# Patient Record
Sex: Male | Born: 1960 | Race: White | Hispanic: No | Marital: Married | Smoking: Former smoker
Health system: Southern US, Community
[De-identification: ages and names within clinical notes are randomized; demographics above are authoritative.]

## PROBLEM LIST (undated history)

## (undated) DIAGNOSIS — E079 Disorder of thyroid, unspecified: Secondary | ICD-10-CM

## (undated) DIAGNOSIS — E7800 Pure hypercholesterolemia, unspecified: Secondary | ICD-10-CM

## (undated) HISTORY — DX: Disorder of thyroid, unspecified: E07.9

---

## 2008-06-06 ENCOUNTER — Encounter: Admission: RE | Admit: 2008-06-06 | Discharge: 2008-06-06 | Payer: Self-pay | Admitting: Endocrinology

## 2010-03-24 IMAGING — US US SOFT TISSUE HEAD/NECK
1 series · 14 of 15 positions shown · non-contrast
Comparison: None

CLINICAL DATA: Thyroid gland enlargement

THYROID ULTRASOUND
TECHNIQUE: Ultrasound examination of the thyroid gland and
adjacent soft tissues was performed.

[Series 1: us soft tissue head/neck · 0.08mm/px · 14 of 15 slices shown]
[im 1/15]
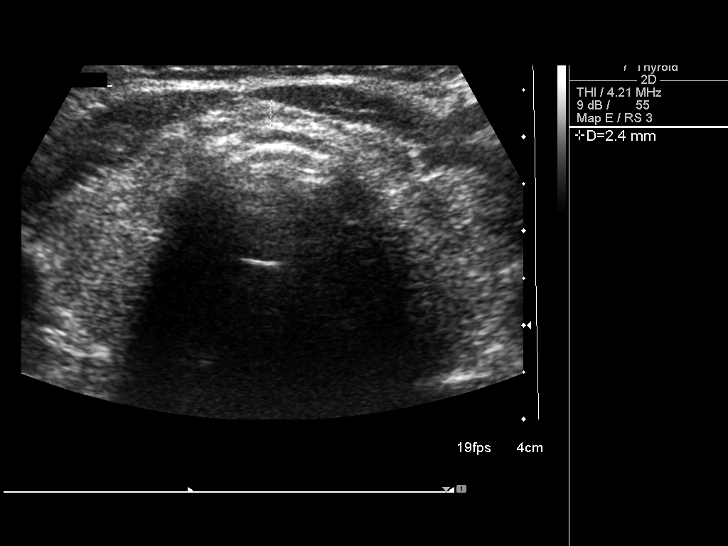
[im 2/15]
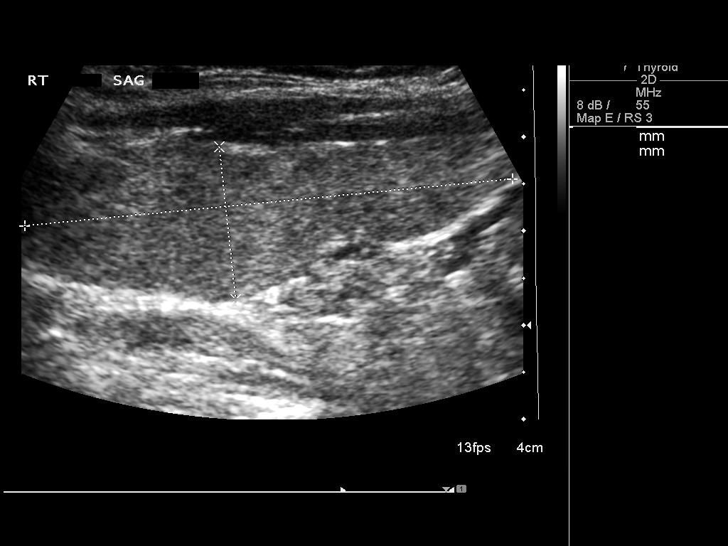
[im 3/15]
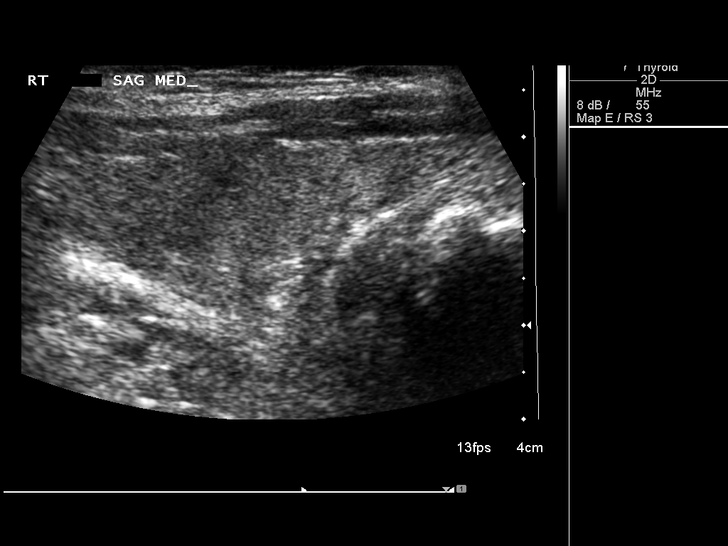
[im 4/15]
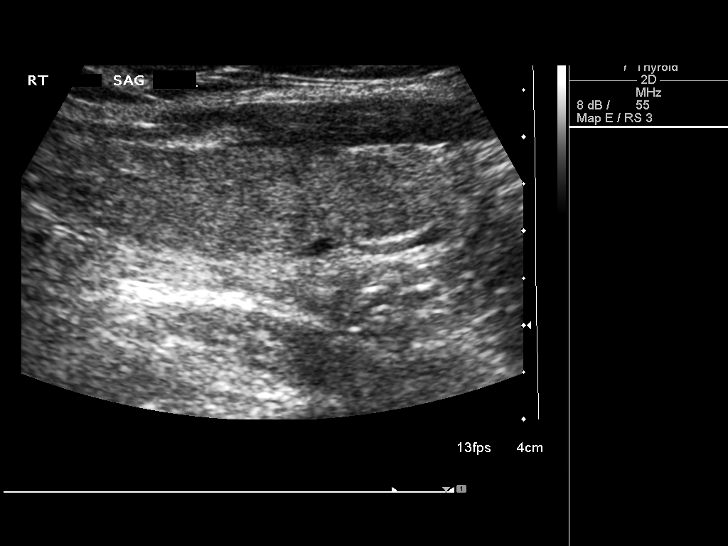
[im 5/15]
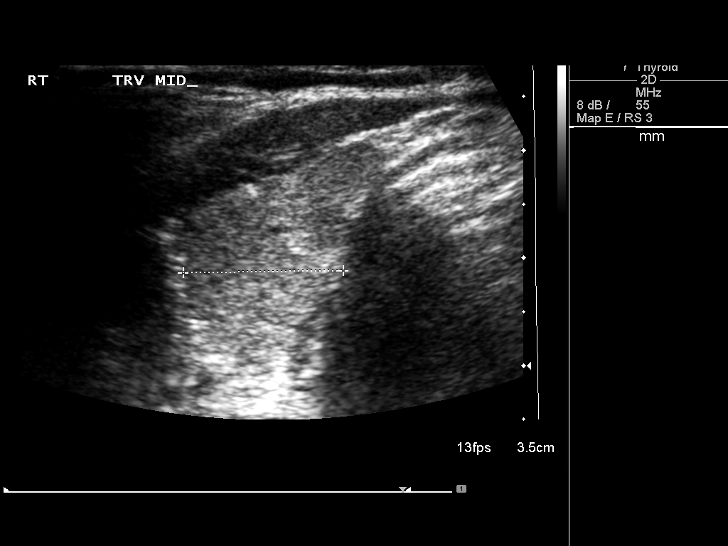
[im 6/15]
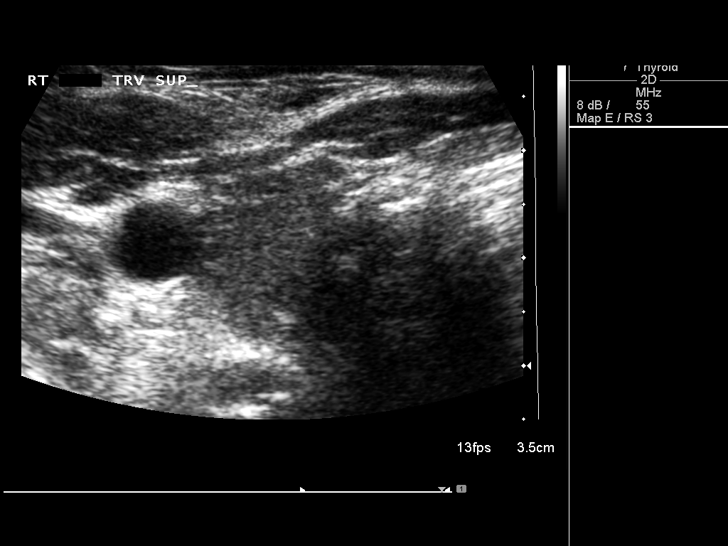
[im 7/15]
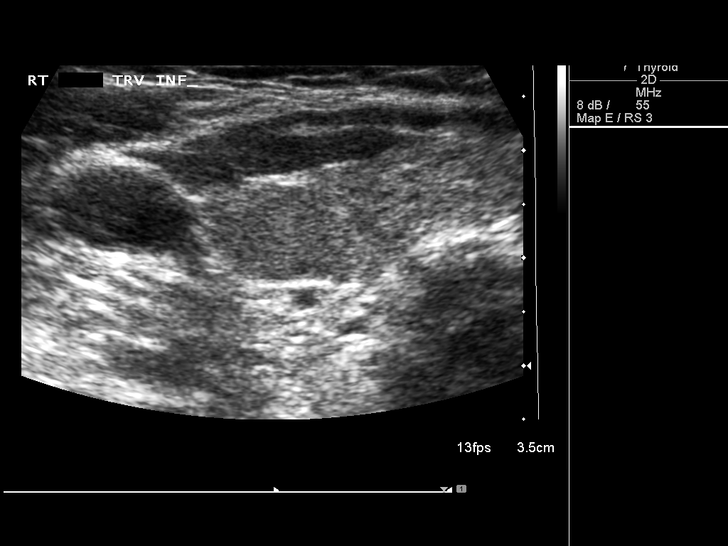
[im 9/15]
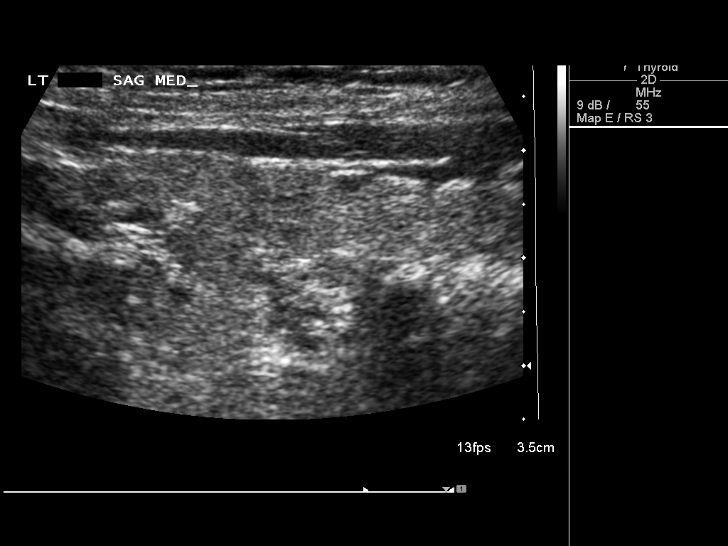
[im 10/15]
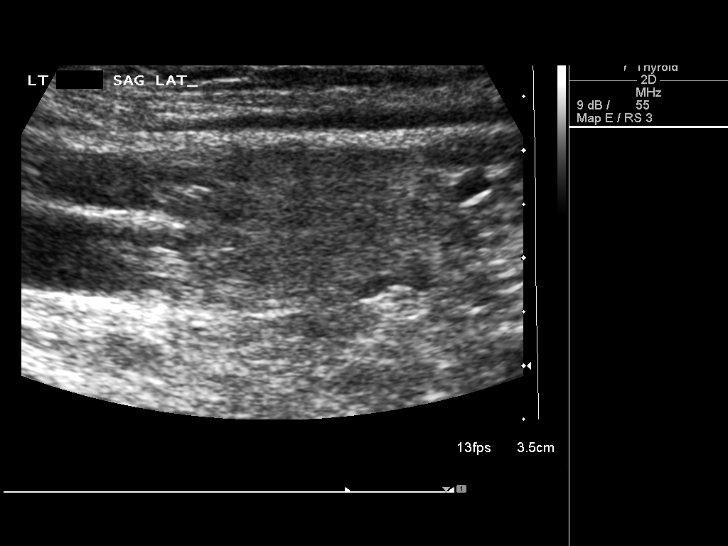
[im 11/15]
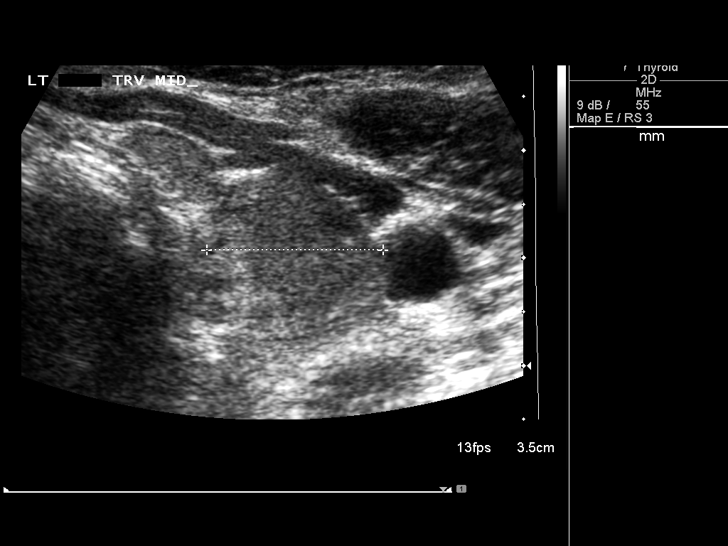
[im 12/15]
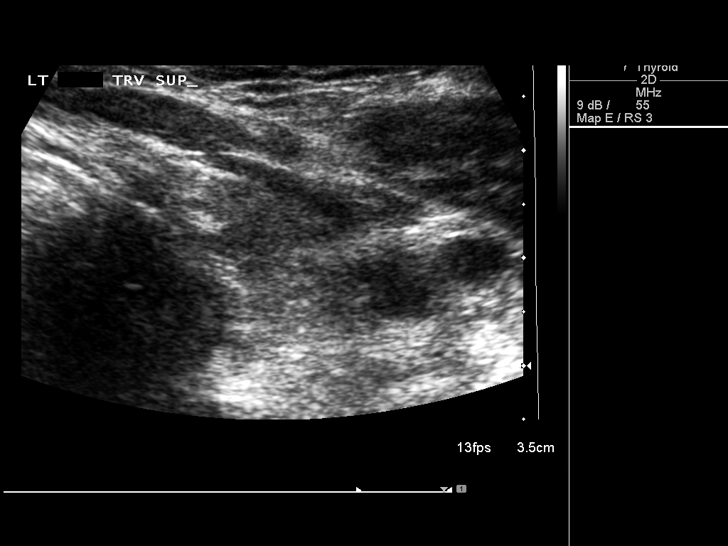
[im 13/15]
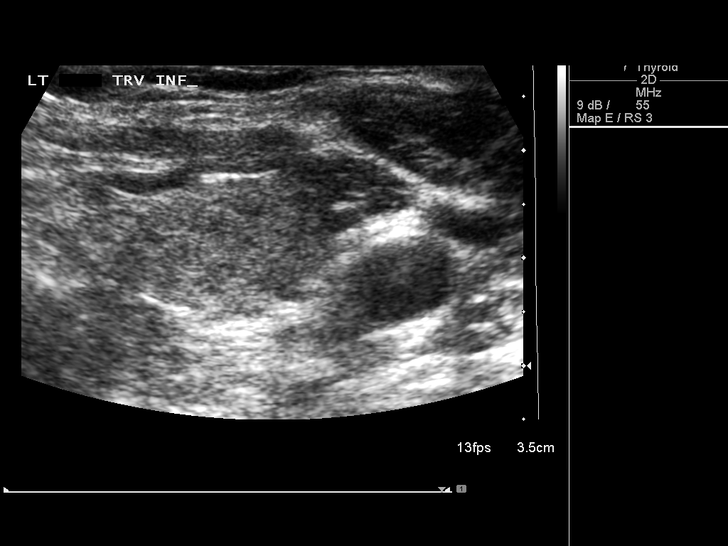
[im 14/15]
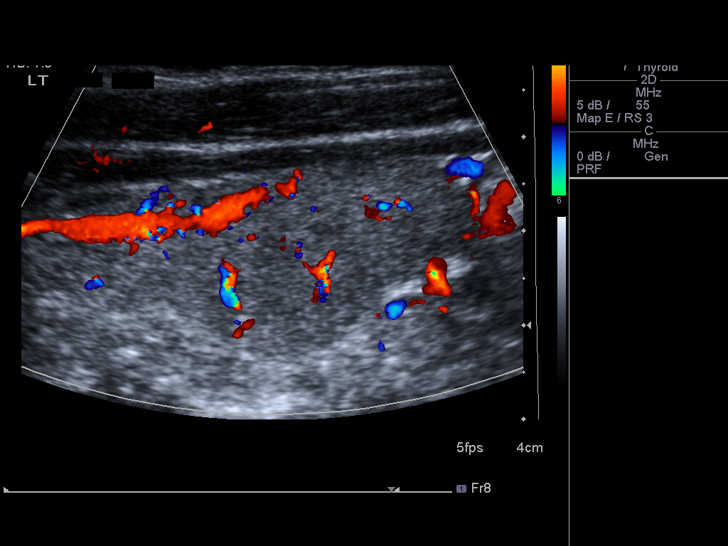
[im 15/15]
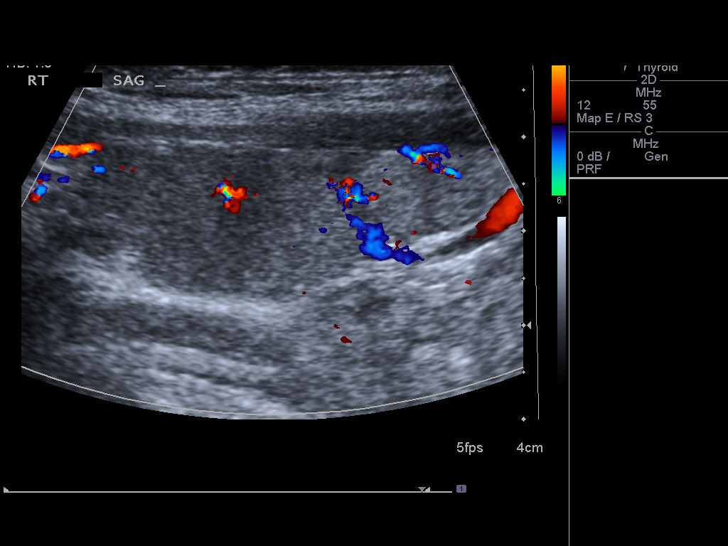

[14 of 15 positions shown; findings below may reference images not displayed]

FINDINGS: The right lobe of the thyroid gland measures 5.2 x 1.6 x
1.5 cm.  No nodule identified.

The left lobe measures 4.6 x 1.9 x 1.6 cm.  No nodule identified.

The isthmus measures 2.4 mm.  No nodule noted.
IMPRESSION: 1.  Normal thyroid sonogram.  No nodule or mass identified.

## 2014-08-18 ENCOUNTER — Other Ambulatory Visit: Payer: Self-pay | Admitting: Endocrinology

## 2014-08-18 DIAGNOSIS — N6320 Unspecified lump in the left breast, unspecified quadrant: Secondary | ICD-10-CM

## 2014-08-24 ENCOUNTER — Other Ambulatory Visit: Payer: Self-pay | Admitting: Endocrinology

## 2014-08-24 ENCOUNTER — Ambulatory Visit
Admission: RE | Admit: 2014-08-24 | Discharge: 2014-08-24 | Disposition: A | Discharge status: Home / Self Care | Payer: BLUE CROSS/BLUE SHIELD | Source: Ambulatory Visit | Attending: Endocrinology | Admitting: Endocrinology

## 2014-08-24 DIAGNOSIS — N6320 Unspecified lump in the left breast, unspecified quadrant: Secondary | ICD-10-CM

## 2014-08-24 DIAGNOSIS — R221 Localized swelling, mass and lump, neck: Secondary | ICD-10-CM

## 2014-08-24 MED ORDER — IOPAMIDOL (ISOVUE-300) INJECTION 61%
75.0000 mL | Freq: Once | INTRAVENOUS | Status: AC | PRN
  Administered 2014-08-24: 75 mL via INTRAVENOUS

## 2014-10-13 ENCOUNTER — Telehealth: Payer: Self-pay

## 2014-10-13 NOTE — Telephone Encounter (Signed)
Spoke with pt, he has [employer]. He needs a referral for his endocrinologist. I advised him that he needs to re-establish with Dr. Alwyn Ren (he is on the insurance card as his PCP). Pt states he has an appt on 10/5 and is unable to come see [place] on Mon or Tues with the dates and times I have given him. Dr Alwyn Ren please advise.

## 2014-10-13 NOTE — Telephone Encounter (Signed)
Patient saw Dr. Alwyn Ren several years ago and was referred to see an endodrynologist. Patient states that Dr. Talmage Nap from [facility] needs a confirmation for the referral that was given. [facility]: [phone] Patient phone: [phone]

## 2014-10-13 NOTE — Telephone Encounter (Signed)
RTC

## 2014-10-15 NOTE — Telephone Encounter (Signed)
I have no record of seeing since we were computerized 3 1/2 years ago.  Needs seen before I can do any referrals.  Douglas Perry

## 2014-10-16 NOTE — Telephone Encounter (Signed)
Left message advising message from Dr. Alwyn Ren.

## 2015-12-27 ENCOUNTER — Ambulatory Visit (INDEPENDENT_AMBULATORY_CARE_PROVIDER_SITE_OTHER): Payer: Commercial Managed Care - HMO | Admitting: Family Medicine

## 2015-12-27 VITALS — BP 126/82 | HR 76 | Temp 97.6°F | Resp 18 | Ht 73.5 in | Wt 262.0 lb

## 2015-12-27 DIAGNOSIS — E6609 Other obesity due to excess calories: Secondary | ICD-10-CM

## 2015-12-27 DIAGNOSIS — Z6834 Body mass index (BMI) 34.0-34.9, adult: Secondary | ICD-10-CM

## 2015-12-27 DIAGNOSIS — Z720 Tobacco use: Secondary | ICD-10-CM | POA: Diagnosis not present

## 2015-12-27 DIAGNOSIS — E034 Atrophy of thyroid (acquired): Secondary | ICD-10-CM

## 2015-12-27 NOTE — Progress Notes (Signed)
Subjective:    Patient ID: Douglas Perry, male    DOB: 11/20/1960, 55 y.o.   MRN: [id number]  12/27/2015  Establish Care   HPI This 55 y.o. male presents to establish care.   Endocrinologist: [facility]; thyroid issues; hypothyroidism.  [facility]  [facility] [employer] is requiring referral to endocrinology.  Having most health maintenance performed at Dr. Danella [facility]'s office.  No recent physical.  Frustrated by 30 minute wait for provider in office today.    Last physical:  None Colonoscopy: 2014   REFUSES FLU VACCINES.   There is no immunization history on file for this patient.   BP Readings from Last 3 Encounters:  12/27/15 126/82   Wt Readings from Last 3 Encounters:  12/27/15 262 lb (118.8 kg)     Review of Systems  Constitutional: Negative for activity change, appetite change, chills, diaphoresis, fatigue, fever and unexpected weight change.  HENT: Negative for congestion, dental problem, drooling, ear discharge, ear pain, facial swelling, hearing loss, mouth sores, nosebleeds, postnasal drip, rhinorrhea, sinus pressure, sneezing, sore throat, tinnitus, trouble swallowing and voice change.   Eyes: Negative for photophobia, pain, discharge, redness, itching and visual disturbance.  Respiratory: Negative for apnea, cough, choking, chest tightness, shortness of breath, wheezing and stridor.   Cardiovascular: Negative for chest pain, palpitations and leg swelling.  Gastrointestinal: Negative for abdominal pain, blood in stool, constipation, diarrhea, nausea and vomiting.  Endocrine: Negative for cold intolerance, heat intolerance, polydipsia, polyphagia and polyuria.  Genitourinary: Negative for decreased urine volume, difficulty urinating, discharge, dysuria, enuresis, flank pain, frequency, genital sores, hematuria, penile pain, penile swelling, scrotal swelling, testicular pain and urgency.  Musculoskeletal: Negative for  arthralgias, back pain, gait problem, joint swelling, myalgias, neck pain and neck stiffness.  Skin: Negative for color change, pallor, rash and wound.  Allergic/Immunologic: Negative for environmental allergies, food allergies and immunocompromised state.  Neurological: Negative for dizziness, tremors, seizures, syncope, facial asymmetry, speech difficulty, weakness, light-headedness, numbness and headaches.  Hematological: Negative for adenopathy. Does not bruise/bleed easily.  Psychiatric/Behavioral: Negative for agitation, behavioral problems, confusion, decreased concentration, dysphoric mood, hallucinations, self-injury, sleep disturbance and suicidal ideas. The patient is not nervous/anxious and is not hyperactive.     Past Medical History:  Diagnosis Date  . Thyroid disease    History reviewed. No pertinent surgical history. No Known Allergies Current Outpatient Prescriptions  Medication Sig Dispense Refill  . levothyroxine (SYNTHROID, LEVOTHROID) 88 MCG tablet Take 88 mcg by mouth daily before breakfast.     No current facility-administered medications for this visit.    Social History   Social History  . Marital status: Married    Spouse name: N/A  . Number of children: N/A  . Years of education: N/A   Occupational History  . Medical laboratory scientific officerproject manager     international house builder   Social History Main Topics  . Smoking status: Current Every Day Smoker  . Smokeless tobacco: Never Used  . Alcohol use Yes  . Drug use: No  . Sexual activity: Not on file   Other Topics Concern  . Not on file   Social History Narrative  . No narrative on file   Family History  Problem Relation Age of Onset  . Cancer Mother     lung cancer  . Cancer Father     prostate cancer  . Heart disease Father     CHF       Objective:    BP 126/82 (  BP Location: Right Arm, Patient Position: Sitting, Cuff Size: Large)   Pulse 76   Temp 97.6 F (36.4 C) (Oral)   Resp 18   Ht 6' 1.5"  (1.867 m)   Wt 262 lb (118.8 kg)   SpO2 98%   BMI 34.10 kg/m  Physical Exam  Constitutional: He is oriented to person, place, and time. He appears well-developed and well-nourished. No distress.  HENT:  Head: Normocephalic and atraumatic.  Right Ear: External ear normal.  Left Ear: External ear normal.  Nose: Nose normal.  Mouth/Throat: Oropharynx is clear and moist.  Eyes: Conjunctivae and EOM are normal. Pupils are equal, round, and reactive to light.  Neck: Normal range of motion. Neck supple. Carotid bruit is not present. No thyromegaly present.  Cardiovascular: Normal rate, regular rhythm, normal heart sounds and intact distal pulses.  Exam reveals no gallop and no friction rub.   No murmur heard. Pulmonary/Chest: Effort normal and breath sounds normal. He has no wheezes. He has no rales.  Musculoskeletal:       Right shoulder: Normal.       Left shoulder: Normal.       Cervical back: Normal.  Lymphadenopathy:    He has no cervical adenopathy.  Neurological: He is alert and oriented to person, place, and time. He has normal reflexes. No cranial nerve deficit. He exhibits normal muscle tone. Coordination normal.  Skin: Skin is warm and dry. No rash noted. He is not diaphoretic.  Psychiatric: He has a normal mood and affect. His behavior is normal. Judgment and thought content normal.   No results found for this or any previous visit.     Assessment & Plan:   1. Hypothyroidism due to acquired atrophy of thyroid   2. Tobacco abuse   3. Class 1 obesity due to excess calories without serious comorbidity with body mass index (BMI) of 34.0 to 34.9 in adult    -updated medical record during visit.   -encourage annual physical examination for all males > age 57. -refer to Dr. Lurene ShadowBallen of endocrinology for ongoing hypothyroidism management. -encourage smoking cessation. -recommend weight loss and regular exercise.   Orders Placed This Encounter  Procedures  . Ambulatory  referral to Endocrinology    Referral Priority:   Routine    Referral Type:   Consultation    Referral Reason:   Specialty Services Required    Number of Visits Requested:   1   Meds ordered this encounter  Medications  . levothyroxine (SYNTHROID, LEVOTHROID) 88 MCG tablet    Sig: Take 88 mcg by mouth daily before breakfast.    No Follow-up on file.   Margia Wiesen Paulita FujitaMartin Lissete Maestas, M.D. [facility]  [place] [address] [place], [place]  [id number] [phone] phone [phone] fax

## 2015-12-27 NOTE — Patient Instructions (Signed)
     IF you received an x-ray today, you will receive an invoice from [facility]. Please contact  Radiology at [phone] with questions or concerns regarding your invoice.   IF you received labwork today, you will receive an invoice from [facility]. Please contact [facility] at [phone] with questions or concerns regarding your invoice.   Our billing staff will not be able to assist you with questions regarding bills from these companies.  You will be contacted with the lab results as soon as they are available. The fastest way to get your results is to activate your My Chart account. Instructions are located on the last page of this paperwork. If you have not heard from us regarding the results in 2 weeks, please contact this office.      

## 2015-12-28 ENCOUNTER — Telehealth: Payer: Self-pay

## 2015-12-28 NOTE — Telephone Encounter (Signed)
Patient is calling because [facility] Dr. Talmage NapBalan. Please advise!  [phone]

## 2015-12-28 NOTE — Telephone Encounter (Signed)
Once ov notes are complete referral will be placed to Dr. Talmage NapBalan office    thanks

## 2015-12-31 DIAGNOSIS — E034 Atrophy of thyroid (acquired): Secondary | ICD-10-CM | POA: Insufficient documentation

## 2015-12-31 DIAGNOSIS — Z720 Tobacco use: Secondary | ICD-10-CM | POA: Insufficient documentation

## 2015-12-31 DIAGNOSIS — Z6834 Body mass index (BMI) 34.0-34.9, adult: Secondary | ICD-10-CM

## 2015-12-31 DIAGNOSIS — E6609 Other obesity due to excess calories: Secondary | ICD-10-CM | POA: Insufficient documentation

## 2015-12-31 NOTE — Telephone Encounter (Signed)
Note completed. [facility]

## 2016-06-10 IMAGING — CT CT NECK W/ CM
3 of 5 series · 13 of 33 positions shown, 16 images · IV contrast (iopamidol)
Comparison: Thyroid ultrasound 06/06/2008

CLINICAL DATA: Left-sided neck mass present for 2 weeks.  Painless.

EXAM:
CT NECK WITH CONTRAST
TECHNIQUE: Multidetector CT imaging of the neck was performed using the
standard protocol following the bolus administration of intravenous
contrast.
CONTRAST:  75mL O76AJK-HCC IOPAMIDOL (O76AJK-HCC) INJECTION 61%

[Series 3: neck · axial · 0.45mm/px · z∈[-288,-96]mm · 5 of 98 slices shown, 7 images]
[im 17/98  soft-tissue]
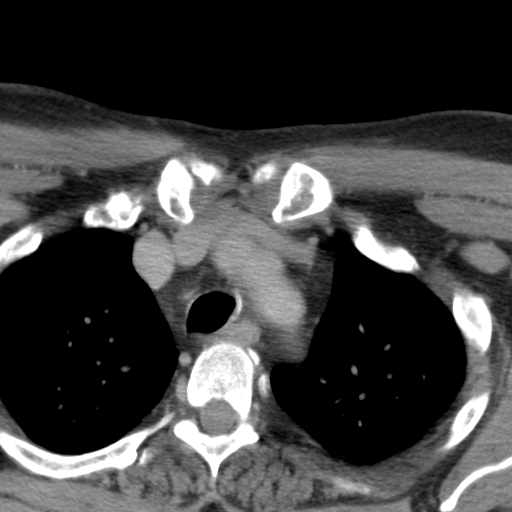
[im 17/98  bone]
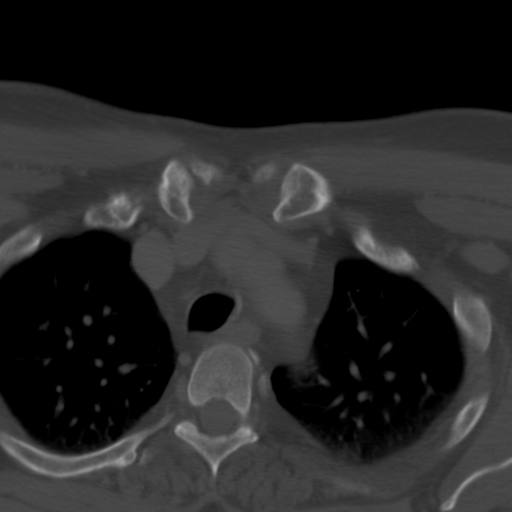
[im 33/98  bone]
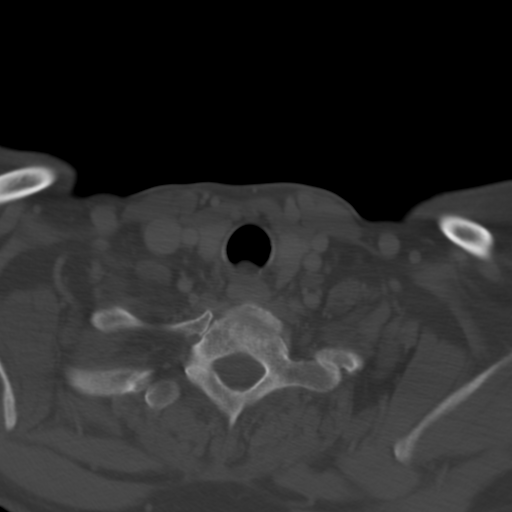
[im 49/98  bone]
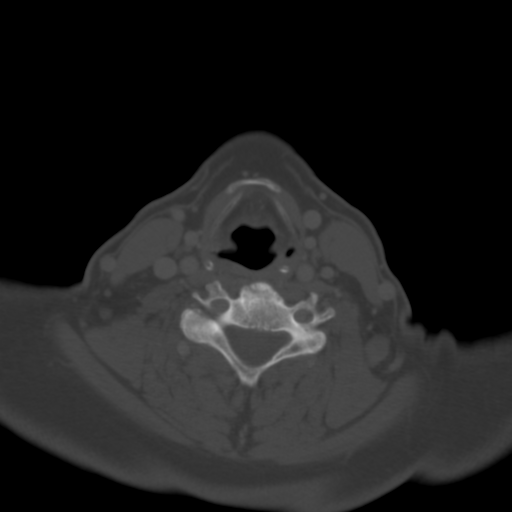
[im 65/98  bone]
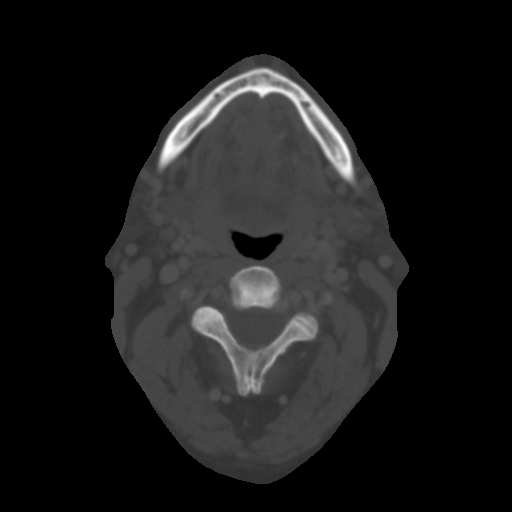
[im 81/98  soft-tissue]
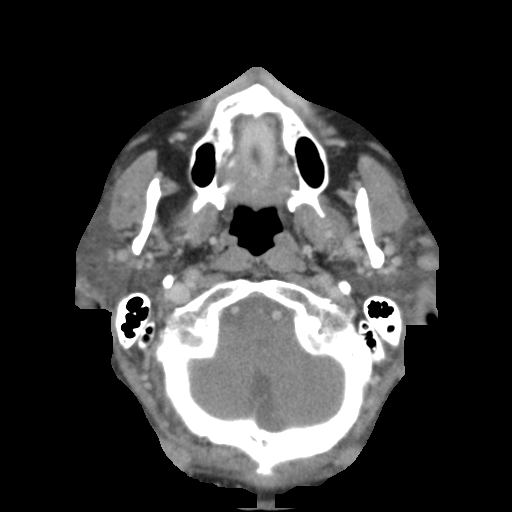
[im 81/98  bone]
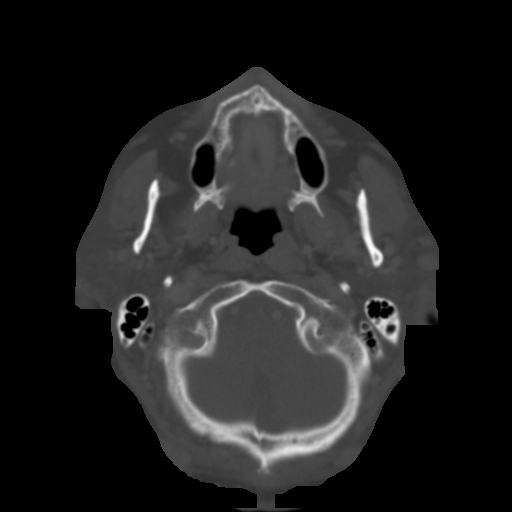

[Series 5: cor neck · coronal · 0.45mm/px · 3 of 76 slices shown]
[im 16/76  bone]
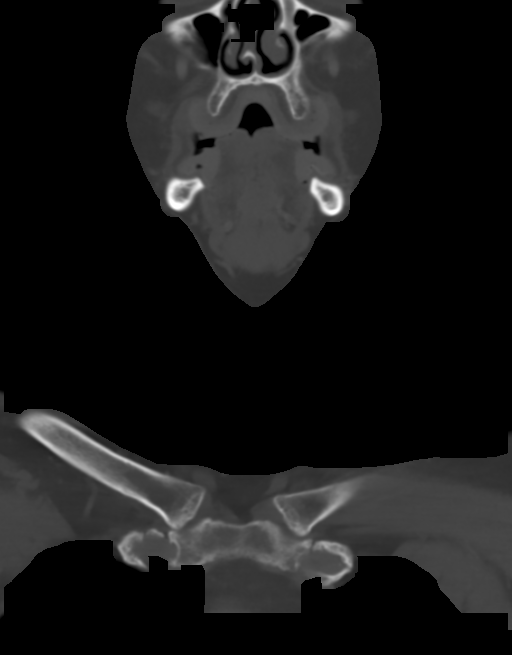
[im 31/76  bone]
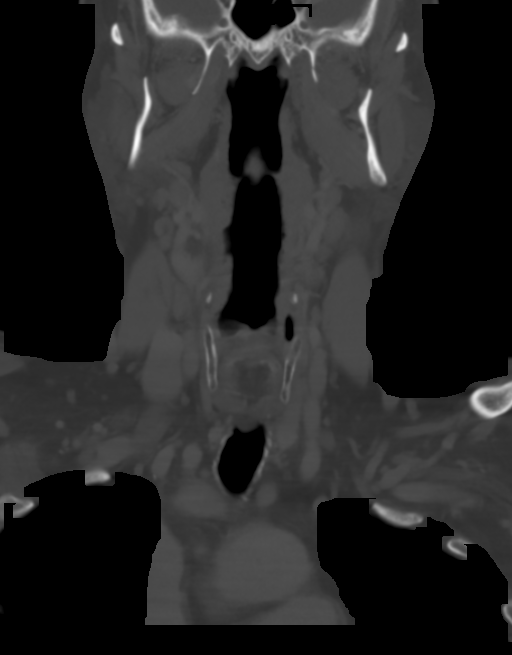
[im 46/76  bone]
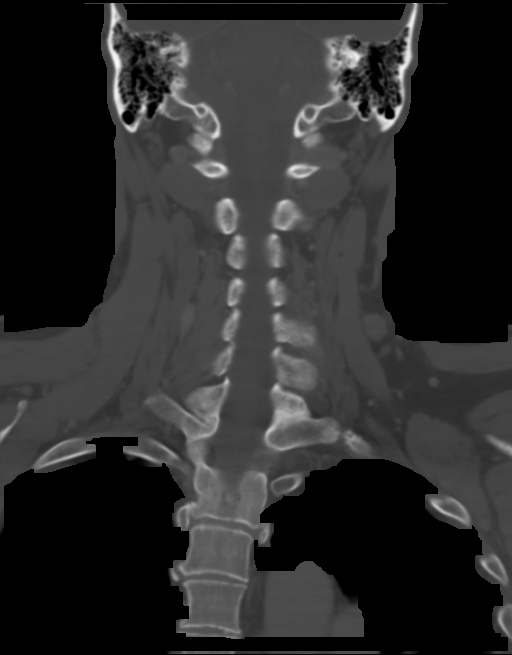

[Series 6: sag neck · sagittal · 0.47mm/px · 5 of 77 slices shown, 6 images]
[im 26/77  bone]
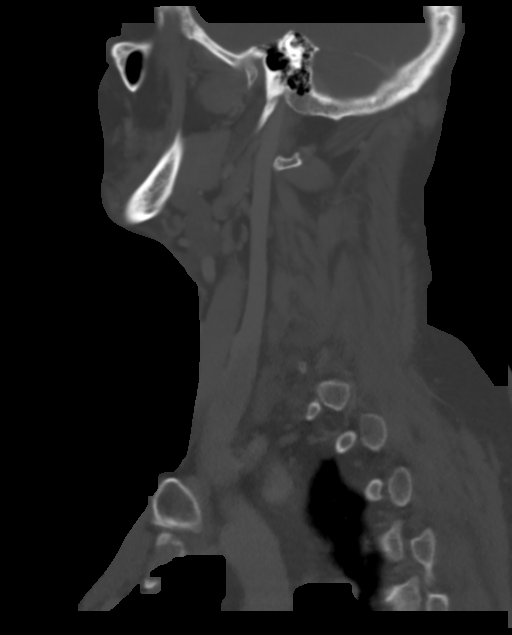
[im 32/77  bone]
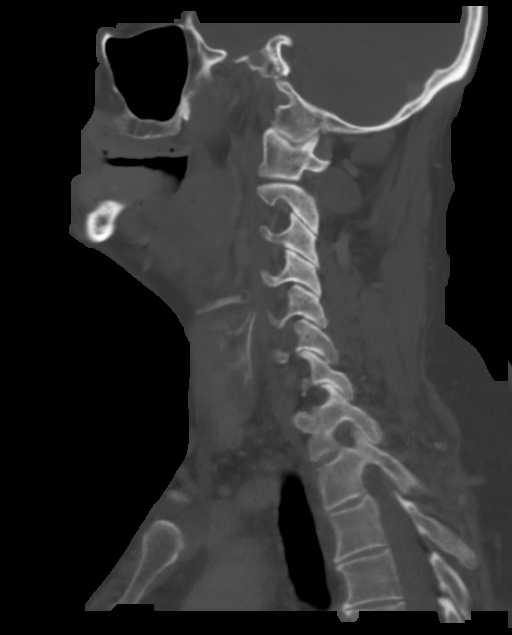
[im 39/77  soft-tissue]
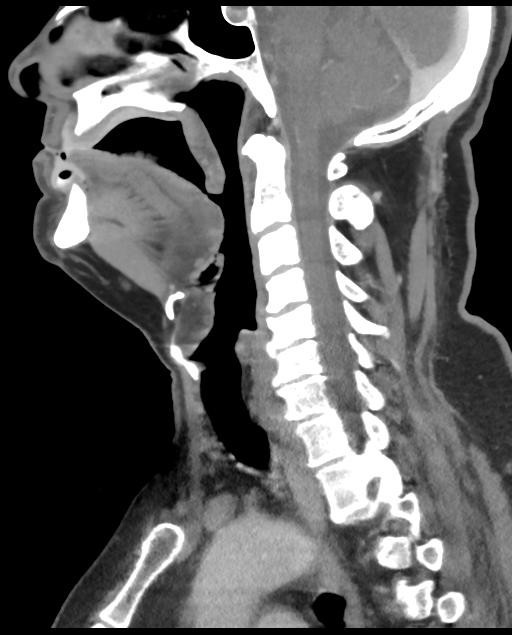
[im 39/77  bone]
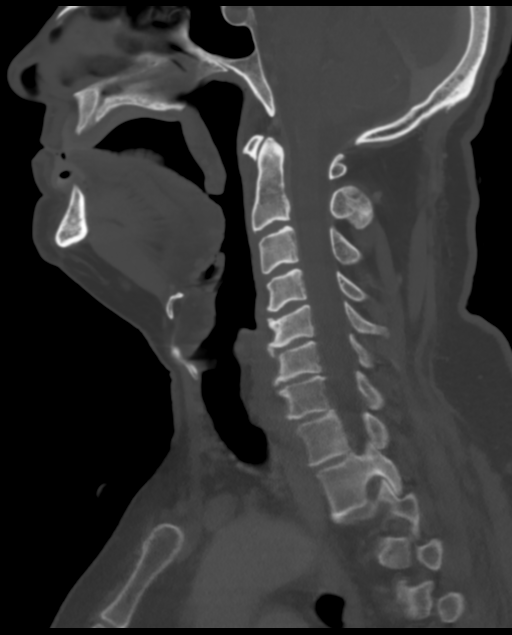
[im 45/77  bone]
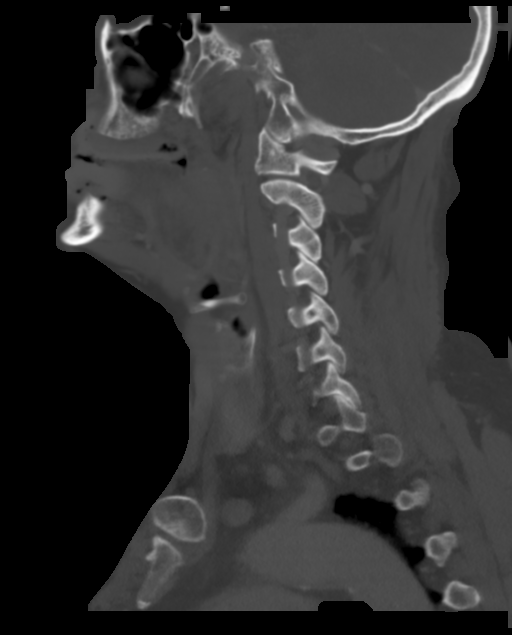
[im 51/77  bone]
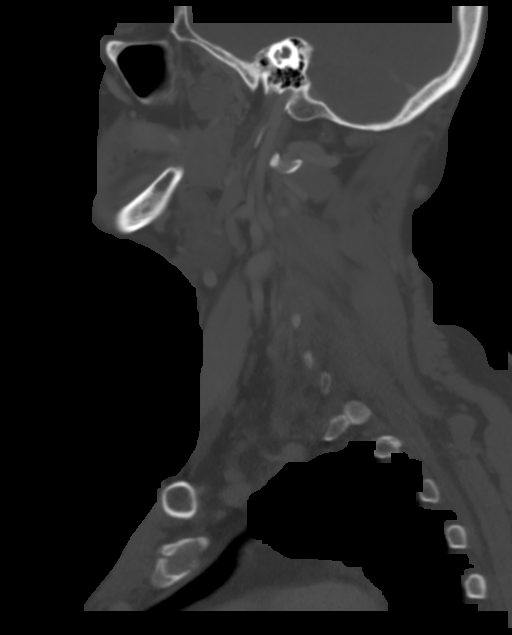

[13 of 33 positions shown; findings below may reference images not displayed]

FINDINGS: Pharynx and larynx: The nasopharynx, oropharynx, hypopharynx, and
larynx are unremarkable aside from a small left tonsillith.

Salivary glands: The submandibular glands are unremarkable. The
parotid glands are unremarkable aside from a few bilateral
subcentimeter soft tissue nodules consistent with intraparotid lymph
nodes, with the 2 largest demonstrating fatty hila.

Thyroid: Thyroid is unremarkable.

Lymph nodes: The palpable abnormality in the left neck corresponds
to an enlarged level V lymph node measuring 10 mm in short axis
(series 3, image 50). There are a few sub 5 mm, symmetric level V
lymph nodes elsewhere bilaterally. A slightly prominent right level
II/ III lymph node measures 7 mm (series 3, image 46). There is a
slightly increased number of small bilateral anterior cervical lymph
nodes elsewhere without additional lymph nodes identified which are
enlarged by CT criteria.

Vascular: Major vascular structures of the neck appear patent. There
is mild aortic arch atherosclerosis.

Limited intracranial: The visualized portion of the brain is
unremarkable.

Visualized orbits: Unremarkable.

Mastoids and visualized paranasal sinuses: Clear.

Skeleton: Mild-to-moderate lower cervical spondylosis.

Upper chest: Visualized lung apices are grossly clear.
IMPRESSION: Enlarged 10 mm left level V lymph node with a mildly increased
number of small lymph nodes elsewhere throughout the neck. These
lymph nodes are nonspecific but may reflect an inflammatory process
given their relatively small size and lack of a primary neck mass.
Consider close clinical follow-up to assess for improvement with
consideration of repeat imaging and possible biopsy if lymph node
enlargement increases.

## 2017-06-08 ENCOUNTER — Encounter: Payer: Self-pay | Admitting: Family Medicine

## 2022-01-27 ENCOUNTER — Ambulatory Visit
Admission: EM | Admit: 2022-01-27 | Discharge: 2022-01-27 | Disposition: A | Discharge status: Home / Self Care | Payer: 59

## 2022-01-27 DIAGNOSIS — J020 Streptococcal pharyngitis: Secondary | ICD-10-CM

## 2022-01-27 HISTORY — DX: Pure hypercholesterolemia, unspecified: E78.00

## 2022-01-27 LAB — POCT RAPID STREP A (OFFICE): Rapid Strep A Screen: POSITIVE — AB

## 2022-01-27 MED ORDER — AMOXICILLIN 875 MG PO TABS: 875.0000 mg | ORAL_TABLET | Freq: Two times a day (BID) | ORAL | 0 refills | Status: AC

## 2022-01-27 NOTE — ED Triage Notes (Signed)
Pt c/o sore throat, nasal congestion started yesterday-NAD-steady gait 

## 2022-01-27 NOTE — ED Provider Notes (Signed)
[facility]  Note:  This document was prepared using Systems analyst and may include unintentional dictation errors.  MRN: [id number] DOB: 02-Jul-1960  Subjective:   Bentley Fissel is a 62 y.o. male presenting for 2 day history of sinus congestion, throat pain, fever, coughing. No chest pain, shob, wheezing. No smoking, vapes nicotine daily. No history of asthma. Did a home test at home, was negative. Does not want a COVID test through our clinic.  No current facility-administered medications for this encounter.  Current Outpatient Medications:    simvastatin (ZOCOR) 10 MG tablet, Take 10 mg by mouth daily., Disp: , Rfl:    levothyroxine (SYNTHROID, LEVOTHROID) 88 MCG tablet, Take 88 mcg by mouth daily before breakfast., Disp: , Rfl:    No Known Allergies  Past Medical History:  Diagnosis Date   High cholesterol    Thyroid disease      History reviewed. No pertinent surgical history.  Family History  Problem Relation Age of Onset   Cancer Mother        lung cancer   Cancer Father        prostate cancer   Heart disease Father        CHF    Social History   Tobacco Use   Smoking status: Former    Types: Cigarettes   Smokeless tobacco: Never  Vaping Use   Vaping Use: Every day  Substance Use Topics   Alcohol use: Yes    Comment: occ   Drug use: No    ROS   Objective:   Vitals: BP 129/80 (BP Location: Right Arm)   Pulse 65   Temp 98.1 F (36.7 C) (Oral)   Resp 16   SpO2 94%   Physical Exam Constitutional:      General: He is not in acute distress.    Appearance: Normal appearance. He is well-developed and normal weight. He is not ill-appearing, toxic-appearing or diaphoretic.  HENT:     Head: Normocephalic and atraumatic.     Right Ear: Tympanic membrane, ear canal and external ear normal. No drainage, swelling or tenderness. No middle ear effusion. There is no impacted cerumen. Tympanic membrane is not  erythematous or bulging.     Left Ear: Tympanic membrane, ear canal and external ear normal. No drainage, swelling or tenderness.  No middle ear effusion. There is no impacted cerumen. Tympanic membrane is not erythematous or bulging.     Nose: Nose normal. No congestion or rhinorrhea.     Mouth/Throat:     Mouth: Mucous membranes are moist.     Pharynx: Pharyngeal swelling and posterior oropharyngeal erythema present. No oropharyngeal exudate or uvula swelling.     Tonsils: No tonsillar exudate or tonsillar abscesses. 1+ on the right. 1+ on the left.  Eyes:     General: No scleral icterus.       Right eye: No discharge.        Left eye: No discharge.     Extraocular Movements: Extraocular movements intact.     Conjunctiva/sclera: Conjunctivae normal.  Cardiovascular:     Rate and Rhythm: Normal rate and regular rhythm.     Heart sounds: Normal heart sounds. No murmur heard.    No friction rub. No gallop.  Pulmonary:     Effort: Pulmonary effort is normal. No respiratory distress.     Breath sounds: Normal breath sounds. No stridor. No wheezing, rhonchi or rales.  Musculoskeletal:     Cervical back:  Normal range of motion and neck supple. No rigidity. No muscular tenderness.  Neurological:     General: No focal deficit present.     Mental Status: He is alert and oriented to person, place, and time.  Psychiatric:        Mood and Affect: Mood normal.        Behavior: Behavior normal.        Thought Content: Thought content normal.    Results for orders placed or performed during the hospital encounter of 01/27/22 (from the past 24 hour(s))  POCT rapid strep A     Status: Abnormal   Collection Time: 01/27/22 12:19 PM  Result Value Ref Range   Rapid Strep A Screen Positive (A) Negative   Assessment and Plan :   PDMP not reviewed this encounter.  1. Strep pharyngitis     Will treat for strep pharyngitis.  Patient is to start amoxicillin, use supportive care otherwise. Deferred  imaging given clear cardiopulmonary exam, hemodynamically stable vital signs. Counseled patient on potential for adverse effects with medications prescribed/recommended today, ER and return-to-clinic precautions discussed, patient verbalized understanding.    Jaynee Eagles, PA-C 01/27/22 1223

## 2023-02-08 ENCOUNTER — Ambulatory Visit (INDEPENDENT_AMBULATORY_CARE_PROVIDER_SITE_OTHER): Payer: 59

## 2023-02-08 ENCOUNTER — Ambulatory Visit
Admission: EM | Admit: 2023-02-08 | Discharge: 2023-02-08 | Disposition: A | Discharge status: Home / Self Care | Payer: 59 | Attending: Family Medicine | Admitting: Family Medicine

## 2023-02-08 ENCOUNTER — Other Ambulatory Visit: Payer: Self-pay

## 2023-02-08 DIAGNOSIS — R051 Acute cough: Secondary | ICD-10-CM

## 2023-02-08 DIAGNOSIS — J069 Acute upper respiratory infection, unspecified: Secondary | ICD-10-CM | POA: Diagnosis not present

## 2023-02-08 NOTE — ED Triage Notes (Signed)
Patient states " I think I have pneumonia" Patient C/O productive cough and hemoptosis, fever and chills, general malaise. Symptoms x 3 days. Patient is not taking any OTC meds.

## 2023-02-08 NOTE — ED Provider Notes (Signed)
[facility]    CSN: [id number] Arrival date & time: 02/08/23  9629      History   Chief Complaint Chief Complaint  Patient presents with   Cough    Fever and chills    HPI Douglas Perry is a 63 y.o. male  presents for evaluation of URI symptoms for 3 days. Patient reports associated symptoms of cough, congestion, subjective fevers, chills, fatigue/malaise. Denies N/V/D, sore throat, ear pain, body aches.  Did state he had 1 episode where he coughed up some blood and and shortly after had a nosebleed.  Denies any additional episodes. patient does not have a hx of asthma. Patient is an active smoker.   Reports no sick contacts.  Pt has taken thing OTC for symptoms. Pt has no other concerns at this time.    Cough Associated symptoms: fever and shortness of breath     Past Medical History:  Diagnosis Date   High cholesterol    Thyroid disease     Patient Active Problem List   Diagnosis Date Noted   Hypothyroidism due to acquired atrophy of thyroid 12/31/2015   Tobacco abuse 12/31/2015   Class 1 obesity due to excess calories without serious comorbidity with body mass index (BMI) of 34.0 to 34.9 in adult 12/31/2015    History reviewed. No pertinent surgical history.     Home Medications    Prior to Admission medications   Medication Sig Start Date End Date Taking? Authorizing Provider  amoxicillin (AMOXIL) 875 MG tablet Take 1 tablet (875 mg total) by mouth 2 (two) times daily. 01/27/22   Wallis Bamberg, PA-C  levothyroxine (SYNTHROID, LEVOTHROID) 88 MCG tablet Take 88 mcg by mouth daily before breakfast.    [provider]  simvastatin (ZOCOR) 10 MG tablet Take 10 mg by mouth daily.    [provider]    Family History Family History  Problem Relation Age of Onset   Cancer Mother        lung cancer   Cancer Father        prostate cancer   Heart disease Father        CHF    Social History Social History   Tobacco Use   Smoking  status: Former    Types: Cigarettes   Smokeless tobacco: Never  Vaping Use   Vaping status: Every Day  Substance Use Topics   Alcohol use: Yes    Comment: occ   Drug use: No     Allergies   Patient has no known allergies.   Review of Systems Review of Systems  Constitutional:  Positive for fatigue and fever.  HENT:  Positive for congestion.   Respiratory:  Positive for cough and shortness of breath.      Physical Exam Triage Vital Signs ED Triage Vitals  Encounter Vitals Group     BP 02/08/23 1043 111/79     Systolic BP Percentile --      Diastolic BP Percentile --      Pulse Rate 02/08/23 1043 80     Resp 02/08/23 1043 18     Temp 02/08/23 1043 97.9 F (36.6 C)     Temp Source 02/08/23 1043 Oral     SpO2 02/08/23 1043 94 %     Weight --      Height --      Head Circumference --      Peak Flow --      Pain Score 02/08/23 1044 1  Pain Loc --      Pain Education --      Exclude from Growth Chart --    No data found.  Updated Vital Signs BP 111/79 (BP Location: Left Arm)   Pulse 80   Temp 97.9 F (36.6 C) (Oral)   Resp 18   SpO2 94%   Visual Acuity Right Eye Distance:   Left Eye Distance:   Bilateral Distance:    Right Eye Near:   Left Eye Near:    Bilateral Near:     Physical Exam Vitals and nursing note reviewed.  Constitutional:      General: He is not in acute distress.    Appearance: Normal appearance. He is not ill-appearing or toxic-appearing.  HENT:     Head: Normocephalic and atraumatic.     Right Ear: Tympanic membrane and ear canal normal.     Left Ear: Tympanic membrane and ear canal normal.     Nose: Congestion present.     Mouth/Throat:     Mouth: Mucous membranes are moist.     Pharynx: No oropharyngeal exudate or posterior oropharyngeal erythema.  Eyes:     Pupils: Pupils are equal, round, and reactive to light.  Cardiovascular:     Rate and Rhythm: Normal rate and regular rhythm.     Heart sounds: Normal heart sounds.   Pulmonary:     Effort: Pulmonary effort is normal.     Breath sounds: Normal breath sounds. No wheezing or rhonchi.  Musculoskeletal:     Cervical back: Normal range of motion and neck supple.  Lymphadenopathy:     Cervical: No cervical adenopathy.  Skin:    General: Skin is warm and dry.  Neurological:     General: No focal deficit present.     Mental Status: He is alert and oriented to person, place, and time.  Psychiatric:        Mood and Affect: Mood normal.        Behavior: Behavior normal.      UC Treatments / Results  Labs (all labs ordered are listed, but only abnormal results are displayed) Labs Reviewed - No data to display  EKG   Radiology No results found.  Procedures Procedures (including critical care time)  Medications Ordered in UC Medications - No data to display  Initial Impression / Assessment and Plan / UC Course  I have reviewed the triage vital signs and the nursing notes.  Pertinent labs & imaging results that were available during my care of the patient were reviewed by me and considered in my medical decision making (see chart for details).     Reviewed exam and symptoms with patient.  No red flags.  He declined flu or COVID testing.  Chest x-ray negative for pneumonia.  Discussed viral illness and symptomatic treatment.  He declined cough medicine will use OTC medications.  Discussed rest and fluids.  PCP follow-up if symptoms do not improve.  ER precautions reviewed. Final Clinical Impressions(s) / UC Diagnoses   Final diagnoses:  Acute cough  Viral upper respiratory illness     Discharge Instructions      Your symptoms and exam are consistent for a viral illness. Please treat your symptoms with over the counter cough medication, tylenol or ibuprofen, humidifier, and rest. Viral illnesses can last 7-14 days. Please follow up with your PCP if your symptoms are not improving. Please go to the ER for any worsening symptoms. This  includes but is not limited to  fever you can not control with tylenol or ibuprofen, you are not able to stay hydrated, you have shortness of breath or chest pain.  Thank you for choosing [facility] for your healthcare needs. I hope you feel better soon!      ED Prescriptions   None    PDMP not reviewed this encounter.   Radford Pax, NP 02/08/23 1154

## 2023-02-08 NOTE — Discharge Instructions (Signed)
Your symptoms and exam are consistent for a viral illness. Please treat your symptoms with over the counter cough medication, tylenol or ibuprofen, humidifier, and rest. Viral illnesses can last 7-14 days. Please follow up with your PCP if your symptoms are not improving. Please go to the ER for any worsening symptoms. This includes but is not limited to fever you can not control with tylenol or ibuprofen, you are not able to stay hydrated, you have shortness of breath or chest pain.  Thank you for choosing [facility] for your healthcare needs. I hope you feel better soon!
